# Patient Record
Sex: Female | Born: 1960 | Race: White | Hispanic: No | Smoking: Never smoker
Health system: Southern US, Community
[De-identification: ages and names within clinical notes are randomized; demographics above are authoritative.]

## PROBLEM LIST (undated history)

## (undated) HISTORY — PX: ABDOMINAL RECTUS MYOCUTANEOUS FLAP: SHX1112

## (undated) HISTORY — PX: PLACEMENT OF BREAST IMPLANTS: SHX6334

## (undated) HISTORY — PX: BACK SURGERY: SHX140

## (undated) HISTORY — PX: ABDOMINOPLASTY: SUR9

---

## 2017-12-14 ENCOUNTER — Emergency Department (HOSPITAL_COMMUNITY)
Admission: EM | Admit: 2017-12-14 | Discharge: 2017-12-14 | Disposition: A | Discharge status: Home / Self Care | Payer: BLUE CROSS/BLUE SHIELD | Attending: Emergency Medicine | Admitting: Emergency Medicine

## 2017-12-14 ENCOUNTER — Encounter (HOSPITAL_COMMUNITY): Payer: Self-pay | Admitting: Emergency Medicine

## 2017-12-14 ENCOUNTER — Other Ambulatory Visit: Payer: Self-pay

## 2017-12-14 ENCOUNTER — Emergency Department (HOSPITAL_COMMUNITY): Payer: BLUE CROSS/BLUE SHIELD

## 2017-12-14 DIAGNOSIS — R600 Localized edema: Secondary | ICD-10-CM | POA: Insufficient documentation

## 2017-12-14 DIAGNOSIS — R339 Retention of urine, unspecified: Secondary | ICD-10-CM | POA: Insufficient documentation

## 2017-12-14 DIAGNOSIS — R140 Abdominal distension (gaseous): Secondary | ICD-10-CM | POA: Insufficient documentation

## 2017-12-14 DIAGNOSIS — R609 Edema, unspecified: Secondary | ICD-10-CM

## 2017-12-14 LAB — URINALYSIS, ROUTINE W REFLEX MICROSCOPIC
BILIRUBIN URINE: NEGATIVE
Glucose, UA: NEGATIVE mg/dL
Hgb urine dipstick: NEGATIVE
KETONES UR: NEGATIVE mg/dL
Leukocytes, UA: NEGATIVE
NITRITE: NEGATIVE
Protein, ur: NEGATIVE mg/dL
Specific Gravity, Urine: 1.003 — ABNORMAL LOW (ref 1.005–1.030)
pH: 8 (ref 5.0–8.0)

## 2017-12-14 LAB — BASIC METABOLIC PANEL
ANION GAP: 10 (ref 5–15)
BUN: 9 mg/dL (ref 6–20)
CHLORIDE: 105 mmol/L (ref 101–111)
CO2: 27 mmol/L (ref 22–32)
Calcium: 9.5 mg/dL (ref 8.9–10.3)
Creatinine, Ser: 0.82 mg/dL (ref 0.44–1.00)
GFR calc Af Amer: >60 mL/min (ref >60)
GFR calc non Af Amer: >60 mL/min (ref >60)
Glucose, Bld: 105 mg/dL — ABNORMAL HIGH (ref 65–99)
POTASSIUM: 3.7 mmol/L (ref 3.5–5.1)
Sodium: 142 mmol/L (ref 135–145)

## 2017-12-14 LAB — CBC
HEMATOCRIT: 39 % (ref 36.0–46.0)
HEMOGLOBIN: 13.1 g/dL (ref 12.0–15.0)
MCH: 32.5 pg (ref 26.0–34.0)
MCHC: 33.6 g/dL (ref 30.0–36.0)
MCV: 96.8 fL (ref 78.0–100.0)
Platelets: 188 10*3/uL (ref 150–400)
RBC: 4.03 MIL/uL (ref 3.87–5.11)
RDW: 12.6 % (ref 11.5–15.5)
WBC: 5.8 10*3/uL (ref 4.0–10.5)

## 2017-12-14 LAB — I-STAT BETA HCG BLOOD, ED (MC, WL, AP ONLY)

## 2017-12-14 LAB — I-STAT TROPONIN, ED: Troponin i, poc: 0 ng/mL (ref 0.00–0.08)

## 2017-12-14 NOTE — ED Triage Notes (Signed)
Pt states that she started a "cleanse" on Friday.  Cilium husks, flax seed, etc.  States that she is eating and drinking normally but has gained 10 lbs in 4 days and states that her stomach is distended.  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  Also mentions chest pressure that started this morning but attributes this to being worried about her health.

## 2017-12-14 NOTE — ED Provider Notes (Signed)
[facility]-EMERGENCY DEPT Provider Note   CSN: [id number] Arrival date & time: 12/14/17  1718     History   Chief Complaint Chief Complaint  Patient presents with  . Urinary Retention    HPI Janet Washington is a 57 y.o. female.  57 year old female without significant prior medical history presents for evaluation of recent abdominal bloating.  Patient reports that 5 days ago she started a GI cleanse.  This consisted of flaxseed and psyllium husks.  During the cleanse, she experienced abdominal bloating and distention.  She denies any associated nausea, vomiting, diarrhea, or fever.  She did feel that she gained several pounds.  She stopped the cleanse 2 days ago and has had significant improvement.  She now feels back to her normal.  She was concerned, however, that she may have had some type of kidney problem and wanted to be checked out.  She reports that she is about to leave on a year-long RV trip.  Her primary care is in [place], [place] where she used to live.  Secondary to her planned trip, she was going to have trouble following up with her primary in any reasonable timeframe.  The time of my exam she feels improved.  She denies any specific complaint at this time.  She specifically does deny chest pain, shortness of breath, nausea, vomiting, fever, current abdominal pain, or other complaint.  The history is provided by the patient.  Illness  This is a new problem. The current episode started more than 2 days ago. The problem occurs rarely. The problem has been resolved. Pertinent negatives include no chest pain, no abdominal pain, no headaches and no shortness of breath. Nothing aggravates the symptoms. Nothing relieves the symptoms. She has tried nothing for the symptoms.    History reviewed. No pertinent past medical history.  There are no active problems to display for this patient.   Past Surgical History:  Procedure Laterality Date  . ABDOMINAL  RECTUS MYOCUTANEOUS FLAP    . ABDOMINOPLASTY    . BACK SURGERY    . CESAREAN SECTION    . PLACEMENT OF BREAST IMPLANTS       OB History   None      Home Medications    Prior to Admission medications   Not on File    Family History No family history on file.  Social History Social History   Tobacco Use  . Smoking status: Never Smoker  . Smokeless tobacco: Never Used  Substance Use Topics  . Alcohol use: Never    Frequency: Never  . Drug use: Yes    Types: Marijuana     Allergies   Erythromycin base and Penicillins   Review of Systems Review of Systems  Respiratory: Negative for shortness of breath.   Cardiovascular: Negative for chest pain.  Gastrointestinal: Positive for abdominal distention. Negative for abdominal pain.  Neurological: Negative for headaches.  All other systems reviewed and are negative.    Physical Exam Updated Vital Signs BP 119/82 (BP Location: Right Arm)   Pulse 66   Temp 98.5 F (36.9 C) (Oral)   Resp 18   SpO2 100%   Physical Exam  Constitutional: She is oriented to person, place, and time. She appears well-developed and well-nourished. No distress.  HENT:  Head: Normocephalic and atraumatic.  Mouth/Throat: Oropharynx is clear and moist.  Eyes: Pupils are equal, round, and reactive to light. Conjunctivae and EOM are normal.  Neck: Normal range of motion. Neck supple.  Cardiovascular: Normal rate, regular rhythm and normal heart sounds.  Pulmonary/Chest: Effort normal and breath sounds normal. No respiratory distress.  Abdominal: Soft. Bowel sounds are normal. There is no tenderness.  Musculoskeletal: Normal range of motion. She exhibits no edema or deformity.  Neurological: She is alert and oriented to person, place, and time.  Skin: Skin is warm and dry.  Psychiatric: She has a normal mood and affect.  Nursing note and vitals reviewed.    ED Treatments / Results  Labs (all labs ordered are listed, but only abnormal  results are displayed) Labs Reviewed  URINALYSIS, ROUTINE W REFLEX MICROSCOPIC - Abnormal; Notable for the following components:      Result Value   Color, Urine STRAW (*)    Specific Gravity, Urine 1.003 (*)    All other components within normal limits  BASIC METABOLIC PANEL - Abnormal; Notable for the following components:   Glucose, Bld 105 (*)    All other components within normal limits  CBC  I-STAT BETA HCG BLOOD, ED ([facility], [facility], [facility] ONLY)  I-STAT TROPONIN, ED    EKG EKG Interpretation  Date/Time:  Wednesday Dec 14 2017 17:32:26 EDT Ventricular Rate:  79 PR Interval:    QRS Duration: 75 QT Interval:  384 QTC Calculation: 441 R Axis:   6 Text Interpretation:  Sinus rhythm Low voltage, precordial leads Nonspecific T abnormalities, lateral leads Baseline wander in lead(s) II III aVF V5 Confirmed by Kristine Royal [phone]) on 12/14/2017 8:30:12 PM   Radiology Dg Chest 2 View  Result Date: 12/14/2017 CLINICAL DATA:  Chest pain EXAM: CHEST - 2 VIEW COMPARISON:  None. FINDINGS: The heart size and mediastinal contours are within normal limits. Both lungs are clear. The visualized skeletal structures are unremarkable. IMPRESSION: No active cardiopulmonary disease. Electronically Signed   By: Jasmine Pang M.D.   On: 12/14/2017 18:25    Procedures Procedures (including critical care time)  Medications Ordered in ED Medications - No data to display   Initial Impression / Assessment and Plan / ED Course  I have reviewed the triage vital signs and the nursing notes.  Pertinent labs & imaging results that were available during my care of the patient were reviewed by me and considered in my medical decision making (see chart for details).     MDM  Patient is presenting for evaluation of reported abdominal distention and reported lower extremity edema.  Exam does not show significant lower extremity edema.  I suspect that the patient's reported abdominal symptoms are secondary to the  recent use of a GI cleanse.  Her symptoms are now improved.  Screening labs do not reveal any other acute pathology. EKG does not show acute ischemia. Chest Xray shows no acute process.   Patient feels improved at the time of my exam.  She desires discharge home.  She understands need for close follow-up.  Strict return precautions are given and understood.  Final Clinical Impressions(s) / ED Diagnoses   Final diagnoses:  Edema, unspecified type    ED Discharge Orders    None       Wynetta Fines, MD 12/14/17 2056

## 2017-12-14 NOTE — Discharge Instructions (Signed)
Please return for any problem. Follow up with your regular physician (if possible) as instructed.

## 2017-12-14 NOTE — ED Notes (Signed)
Pt provided labeled specimen cup for urine collection and escorted to restroom. [employer]

## 2019-08-25 IMAGING — CR DG CHEST 2V
2 series · 2 of 2 positions shown · non-contrast
Comparison: None.

CLINICAL DATA: Chest pain

EXAM:
CHEST - 2 VIEW

[w chest pa]
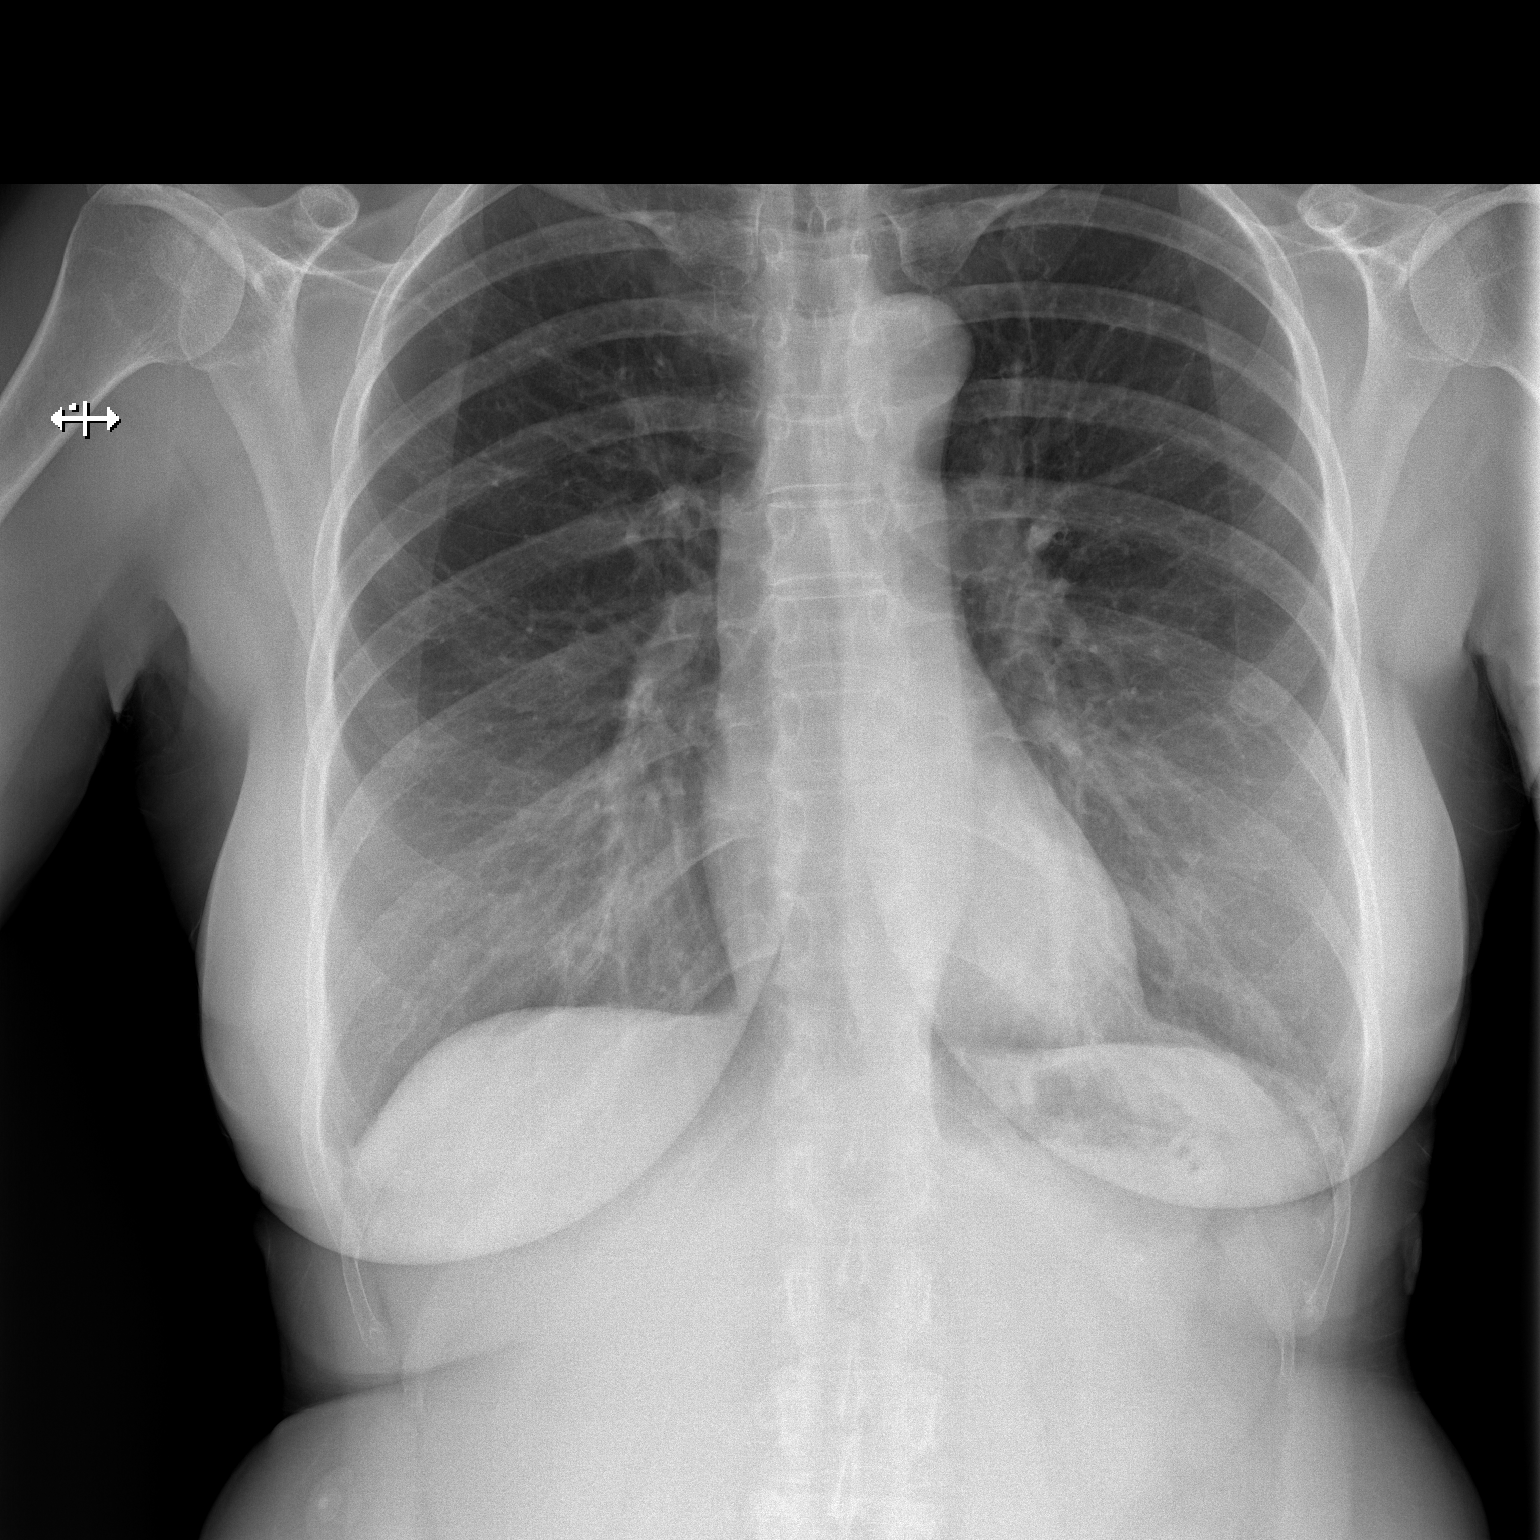

[w chest lat]
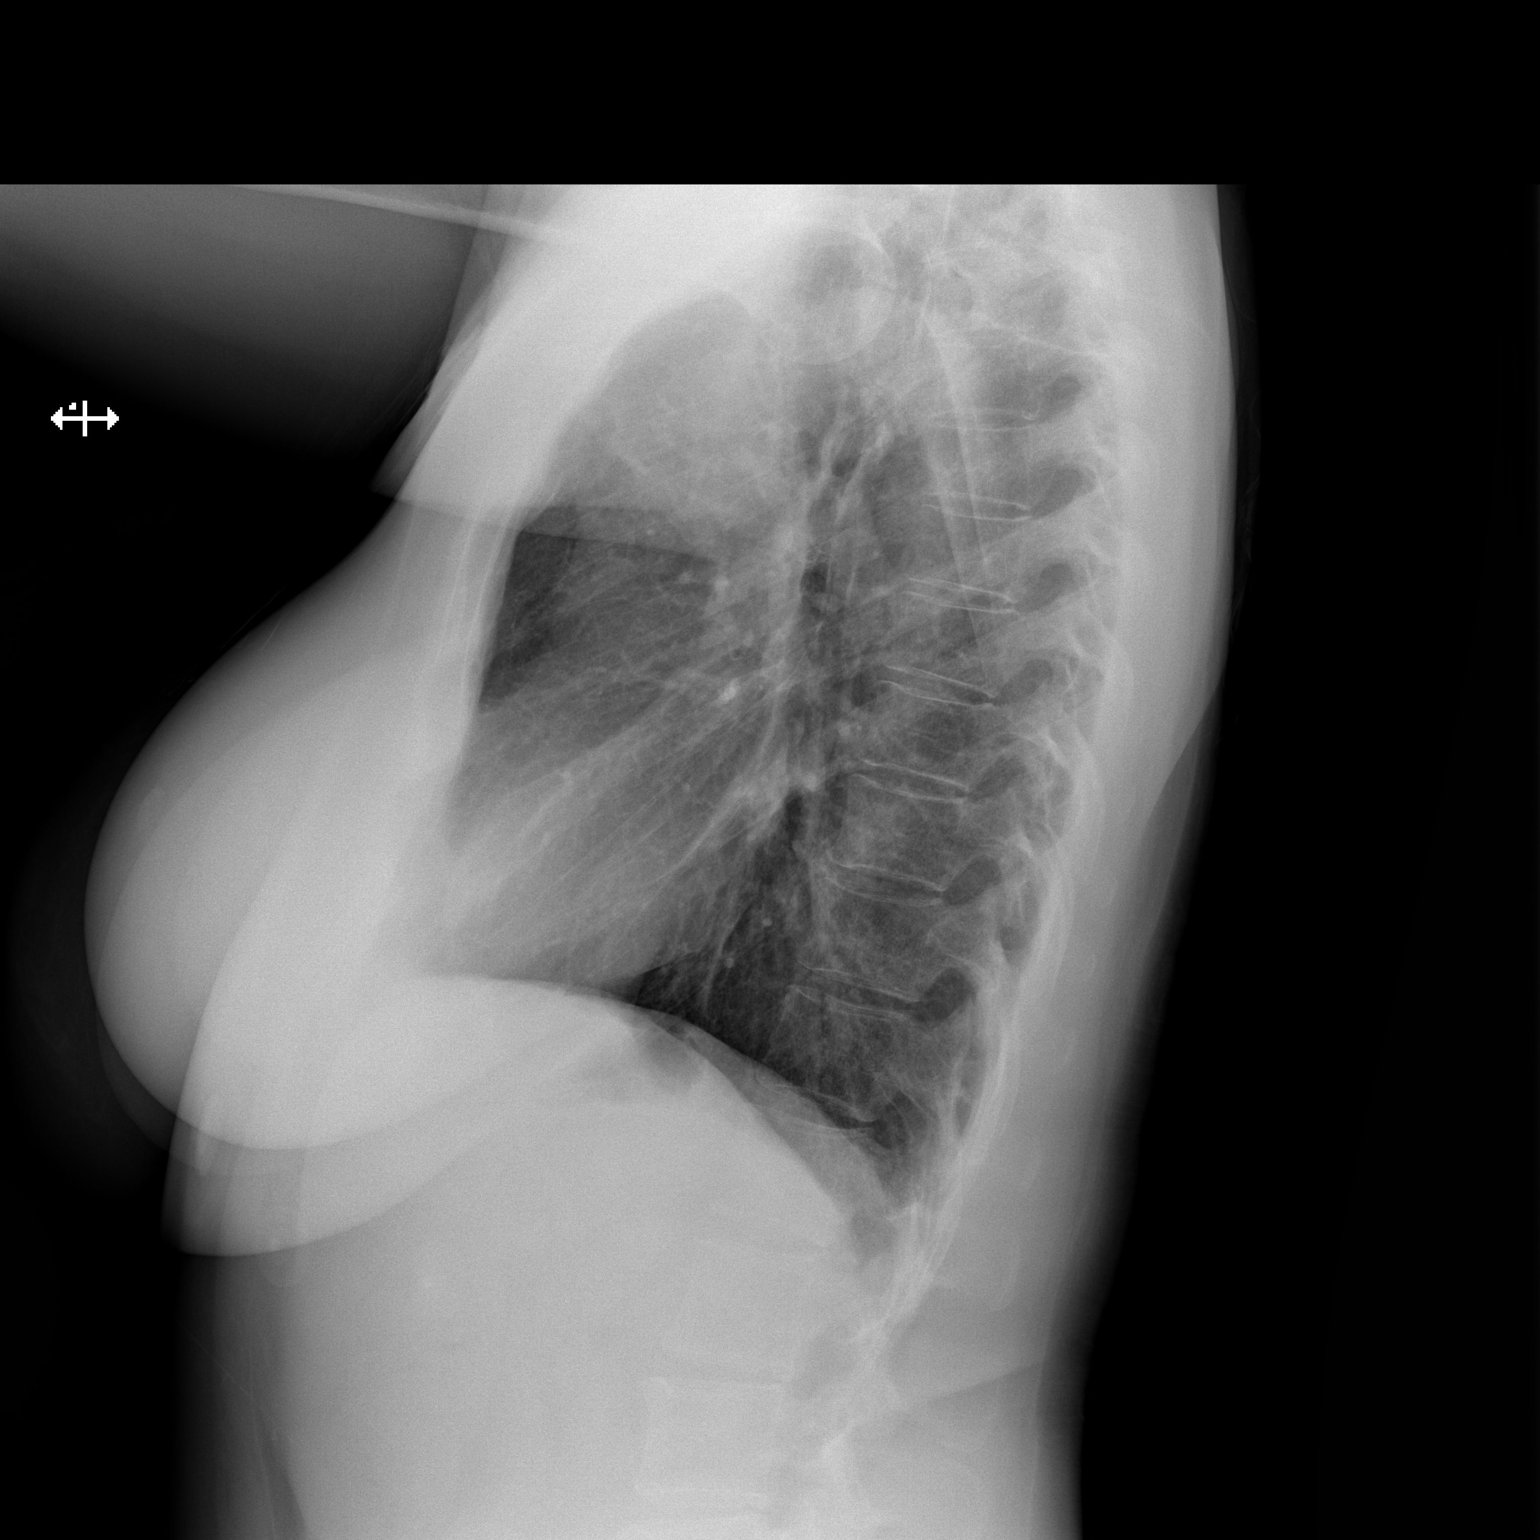

[2 of 2 positions shown; findings below may reference images not displayed]

FINDINGS: The heart size and mediastinal contours are within normal limits.
Both lungs are clear. The visualized skeletal structures are
unremarkable.
IMPRESSION: No active cardiopulmonary disease.
